# Patient Record
Sex: Female | Born: 1935 | Race: White | Hispanic: No | Marital: Married | Smoking: Former smoker
Health system: Southern US, Community
[De-identification: ages and names within clinical notes are randomized; demographics above are authoritative.]

## PROBLEM LIST (undated history)

## (undated) DIAGNOSIS — E876 Hypokalemia: Secondary | ICD-10-CM

## (undated) DIAGNOSIS — C50919 Malignant neoplasm of unspecified site of unspecified female breast: Secondary | ICD-10-CM

## (undated) DIAGNOSIS — M25551 Pain in right hip: Secondary | ICD-10-CM

## (undated) DIAGNOSIS — M7061 Trochanteric bursitis, right hip: Secondary | ICD-10-CM

## (undated) DIAGNOSIS — G8929 Other chronic pain: Secondary | ICD-10-CM

## (undated) DIAGNOSIS — M1990 Unspecified osteoarthritis, unspecified site: Secondary | ICD-10-CM

## (undated) DIAGNOSIS — R600 Localized edema: Secondary | ICD-10-CM

## (undated) DIAGNOSIS — M7989 Other specified soft tissue disorders: Secondary | ICD-10-CM

## (undated) HISTORY — PX: BUNIONECTOMY: SHX129

## (undated) HISTORY — PX: INTRAOPERATIVE ARTERIOGRAM: SHX5157

## (undated) HISTORY — PX: TONSILLECTOMY: SUR1361

## (undated) HISTORY — PX: MASTECTOMY MODIFIED RADICAL: SUR848

## (undated) HISTORY — PX: BREAST BIOPSY: SHX20

---

## 2017-06-02 ENCOUNTER — Encounter: Payer: Self-pay | Admitting: Gynecology

## 2017-06-02 ENCOUNTER — Other Ambulatory Visit: Payer: Self-pay

## 2017-06-02 ENCOUNTER — Ambulatory Visit
Admission: EM | Admit: 2017-06-02 | Discharge: 2017-06-02 | Disposition: A | Discharge status: Home / Self Care | Payer: Medicare Other | Attending: Family Medicine | Admitting: Family Medicine

## 2017-06-02 DIAGNOSIS — H1031 Unspecified acute conjunctivitis, right eye: Secondary | ICD-10-CM | POA: Diagnosis not present

## 2017-06-02 HISTORY — DX: Trochanteric bursitis, right hip: M70.61

## 2017-06-02 HISTORY — DX: Other chronic pain: G89.29

## 2017-06-02 HISTORY — DX: Localized edema: R60.0

## 2017-06-02 HISTORY — DX: Pain in right hip: M25.551

## 2017-06-02 HISTORY — DX: Unspecified osteoarthritis, unspecified site: M19.90

## 2017-06-02 HISTORY — DX: Other specified soft tissue disorders: M79.89

## 2017-06-02 HISTORY — DX: Hypokalemia: E87.6

## 2017-06-02 HISTORY — DX: Malignant neoplasm of unspecified site of unspecified female breast: C50.919

## 2017-06-02 MED ORDER — CIPROFLOXACIN HCL 0.3 % OP SOLN: 1.0000 [drp] | OPHTHALMIC | 0 refills | Status: AC

## 2017-06-02 NOTE — ED Triage Notes (Signed)
Patient c/o x 6 days right eye drainage/ itching and redness.

## 2017-06-02 NOTE — ED Provider Notes (Signed)
[facility]    CSN: [id number] Arrival date & time: 06/02/17  1308     History   Chief Complaint Chief Complaint  Patient presents with  . Eye Drainage    HPI Brandy Carlson is a 82 y.o. female presents to the urgent care facility for evaluation of right eye drainage, itching and irritation.  Patient states Tuesday of this past week she developed some itching with slight clear drainage.  Over the last 1 to 2 days she has had more purulent drainage with matting of the right eyelid.  She denies any eye pain, vision changes.  She wears contacts.  She denies any left eye symptoms.  She denies any foreign body sensation throughout the right eye.  She has been avoiding her contacts over the last 5 days. HPI  No past medical history on file.  There are no active problems to display for this patient.   OB History   None      Home Medications    Prior to Admission medications   Medication Sig Start Date End Date Taking? Authorizing Provider  abemaciclib (VERZENIO) 100 MG tablet Take by mouth. 04/22/17  Yes [provider]  cetirizine (ZYRTEC) 5 MG tablet Take by mouth. 04/22/17 04/22/18 Yes [provider]  Magnesium Oxide, Antacid, 500 MG CAPS Take by mouth. 01/29/17  Yes [provider]  ondansetron (ZOFRAN) 8 MG tablet Take by mouth. 05/17/17  Yes [provider]  oxyCODONE (OXY IR/ROXICODONE) 5 MG immediate release tablet Take by mouth. 05/21/17 06/20/17 Yes [provider]  oxyCODONE (OXYCONTIN) 10 mg 12 hr tablet Take by mouth. 05/21/17  Yes [provider]  Potassium Chloride ER 20 MEQ TBCR Take by mouth. 01/10/17 01/10/18 Yes [provider]  prochlorperazine (COMPAZINE) 10 MG tablet Take by mouth. 04/22/17  Yes [provider]  simvastatin (ZOCOR) 40 MG tablet Take by mouth. 07/19/16  Yes [provider]  triamcinolone cream (KENALOG) 0.1 % Apply topically. 02/22/17  Yes [provider]  vitamin  C (ASCORBIC ACID) 500 MG tablet Take by mouth. 01/29/17 01/29/18 Yes [provider]  bisacodyl (DULCOLAX) 5 MG EC tablet Take by mouth.    [provider]  Cholecalciferol (VITAMIN D3) 2000 units capsule Take by mouth.    [provider]  ciprofloxacin (CILOXAN) 0.3 % ophthalmic solution Place 1 drop into both eyes every 2 (two) hours. Administer 1 drop, every 2 hours, while awake, for 2 days. Then 1 drop, every 4 hours, while awake, for the next 5 days. 06/02/17   Duanne Guess, PA-C  docusate sodium (COLACE) 100 MG capsule Take by mouth.    [provider]  fulvestrant (FASLODEX) 250 MG/5ML injection Inject into the muscle.    [provider]  hydrochlorothiazide (HYDRODIURIL) 25 MG tablet Take by mouth.    [provider]  ibuprofen (ADVIL,MOTRIN) 800 MG tablet Take by mouth.    [provider]  levothyroxine (SYNTHROID, LEVOTHROID) 112 MCG tablet Take by mouth.    [provider]  naproxen (NAPROSYN) 250 MG tablet Take by mouth.    [provider]    Family History Family History  Problem Relation Age of Onset  . Cancer Mother        lung  . Stroke Father     Social History Social History   Tobacco Use  . Smoking status: Former Research scientist (life sciences)  . Smokeless tobacco: Never Used  Substance Use Topics  . Alcohol use: Yes  .  Drug use: Never     Allergies   Demerol [meperidine hcl]   Review of Systems Review of Systems  Constitutional: Negative for fever.  Eyes: Positive for discharge, redness and itching. Negative for photophobia, pain and visual disturbance.  Skin: Negative for wound.  Neurological: Negative for numbness and headaches.     Physical Exam Triage Vital Signs ED Triage Vitals  Enc Vitals Group     BP 06/02/17 1320 (!) 151/64     Pulse Rate 06/02/17 1320 96     Resp 06/02/17 1320 16     Temp 06/02/17 1320 98.6 F (37 C)     Temp Source 06/02/17 1320 Oral     SpO2 06/02/17 1320  96 %     Weight 06/02/17 1321 130 lb (59 kg)     Height --      Head Circumference --      Peak Flow --      Pain Score 06/02/17 1321 1     Pain Loc --      Pain Edu? --      Excl. in [place]? --    No data found.  Updated Vital Signs BP (!) 151/64 (BP Location: Left Arm)   Pulse 96   Temp 98.6 F (37 C) (Oral)   Resp 16   Wt 130 lb (59 kg)   SpO2 96%   Visual Acuity Right Eye Distance:   Left Eye Distance:   Bilateral Distance:    Right Eye Near:   Left Eye Near:    Bilateral Near:     Physical Exam  Constitutional: She is oriented to person, place, and time. She appears well-developed and well-nourished.  HENT:  Head: Normocephalic and atraumatic.  Eyes: Pupils are equal, round, and reactive to light. Conjunctivae are normal.  Right conjunctival erythema with mild purulent discharge and slight matting of the eyelids.  Fluorescein stain is used along with was lamp showing no sign of corneal abrasion or ulceration.  No sign of foreign body on exam.  Pupils equal round reactive light and patient has full extraocular eye movement.  Neck: Normal range of motion.  Cardiovascular: Normal rate.  Pulmonary/Chest: Effort normal. No respiratory distress.  Musculoskeletal: Normal range of motion.  Neurological: She is alert and oriented to person, place, and time.  Skin: Skin is warm. No rash noted.  Psychiatric: She has a normal mood and affect. Her behavior is normal. Thought content normal.     UC Treatments / Results  Labs (all labs ordered are listed, but only abnormal results are displayed) Labs Reviewed - No data to display  EKG None  Radiology No results found.  Procedures Procedures (including critical care time)  Medications Ordered in UC Medications - No data to display  Initial Impression / Assessment and Plan / UC Course  I have reviewed the triage vital signs and the nursing notes.  Pertinent labs & imaging results that were available during my care of  the patient were reviewed by me and considered in my medical decision making (see chart for details).     82 year old female with right eye conjunctivitis.  She uses contacts and will avoid using contacts for 1 week until antibiotics clear up infection.  She is given a prescription for Ciloxan.  She will use antibiotic eyedrops as prescribed. Final Clinical Impressions(s) / UC Diagnoses   Final diagnoses:  Acute bacterial conjunctivitis of right eye     Discharge Instructions     Please avoid contact  use for 1 week until infection resolves.  If any increasing pain, vision changes please follow-up with primary care provider or ophthalmologist.  Please complete 7-day course of topical antibiotics.   ED Prescriptions    Medication Sig Dispense Auth. Provider   ciprofloxacin (CILOXAN) 0.3 % ophthalmic solution Place 1 drop into both eyes every 2 (two) hours. Administer 1 drop, every 2 hours, while awake, for 2 days. Then 1 drop, every 4 hours, while awake, for the next 5 days. 5 mL Renata Caprice       Duanne Guess, [place] 06/02/17 1359

## 2017-06-02 NOTE — Discharge Instructions (Addendum)
Please avoid contact use for 1 week until infection resolves.  If any increasing pain, vision changes please follow-up with primary care provider or ophthalmologist.  Please complete 7-day course of topical antibiotics.

## 2018-07-02 ENCOUNTER — Other Ambulatory Visit: Payer: Self-pay | Admitting: Neurology

## 2018-07-02 DIAGNOSIS — M4807 Spinal stenosis, lumbosacral region: Secondary | ICD-10-CM

## 2018-07-10 ENCOUNTER — Other Ambulatory Visit: Payer: Self-pay

## 2018-07-10 ENCOUNTER — Ambulatory Visit
Admission: RE | Admit: 2018-07-10 | Discharge: 2018-07-10 | Disposition: A | Discharge status: Home / Self Care | Payer: Medicare Other | Source: Ambulatory Visit | Attending: Neurology | Admitting: Neurology

## 2018-07-10 DIAGNOSIS — M4807 Spinal stenosis, lumbosacral region: Secondary | ICD-10-CM

## 2019-08-26 ENCOUNTER — Ambulatory Visit: Payer: Medicare Other | Admitting: Internal Medicine

## 2019-09-16 DEATH — deceased

## 2020-05-03 IMAGING — MR MRI LUMBAR SPINE WITHOUT CONTRAST
1 series · 15 of 15 positions shown · non-contrast
Comparison: MRI lumbar spine 12/29/2014.

CLINICAL DATA: History of metastatic breast cancer. Left hip and
leg pain for 2 months. Difficulty walking. Status post fall 6 weeks
ago.

EXAM:
MRI LUMBAR SPINE WITHOUT CONTRAST
TECHNIQUE: Multiplanar, multisequence MR imaging of the lumbar spine was
performed. No intravenous contrast was administered.

[Series 6: T2 · sagittal · 4.0mm · 0.81mm/px · 15 of 15 slices shown]
[im 1/15]
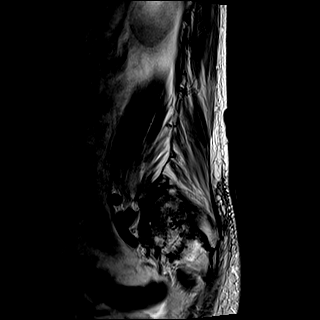
[im 2/15]
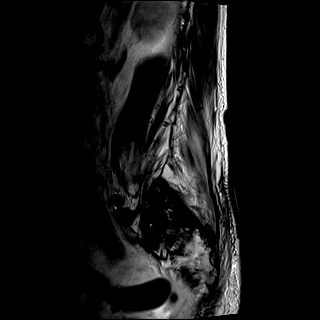
[im 3/15]
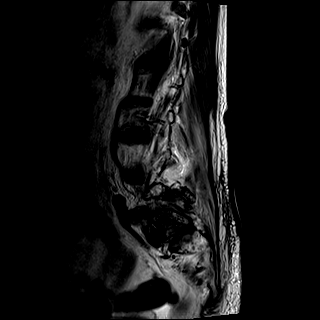
[im 4/15]
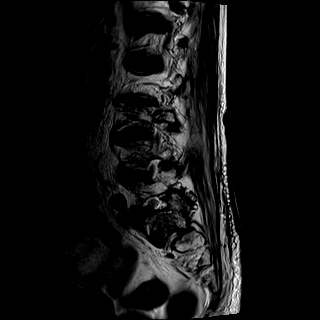
[im 5/15]
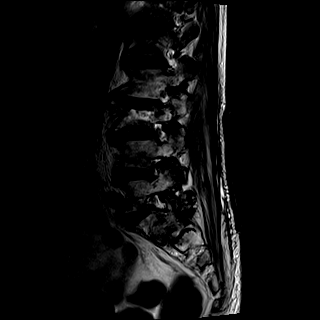
[im 6/15]
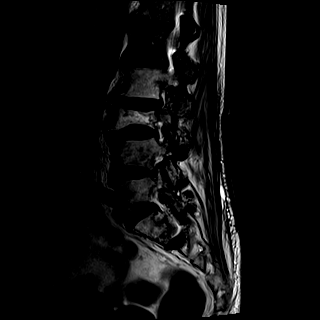
[im 7/15]
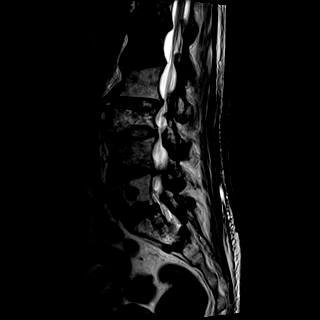
[im 8/15]
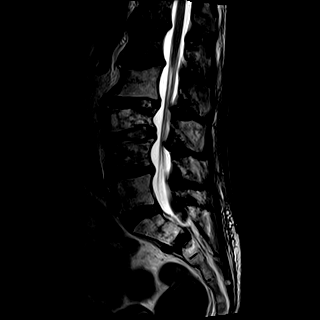
[im 9/15]
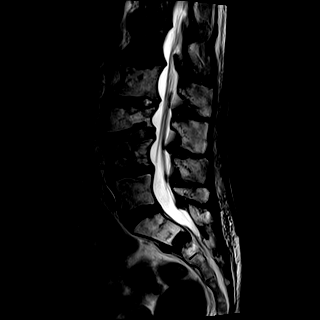
[im 10/15]
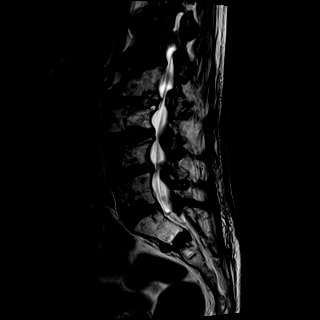
[im 11/15]
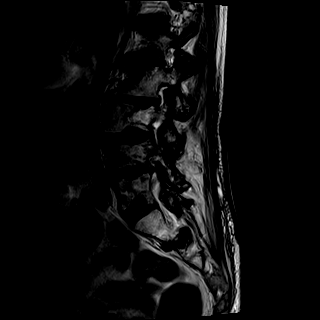
[im 12/15]
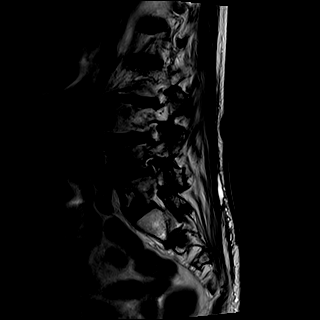
[im 13/15]
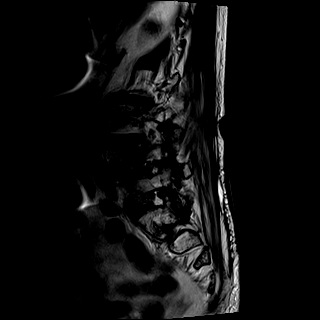
[im 14/15]
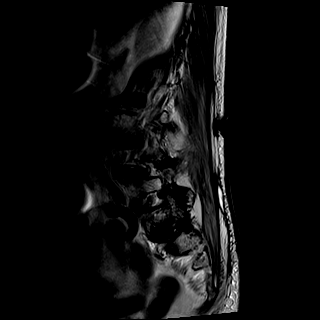
[im 15/15]
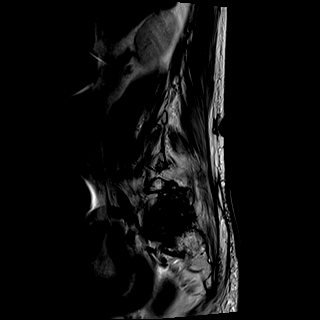

[15 of 15 positions shown; findings below may reference images not displayed]

FINDINGS: Segmentation:  Standard.

Alignment:  Maintained.

Vertebrae: Multiple marrow lesions are identified consistent with
metastatic disease. There is a biconcave compression fracture of L3
with vertebral body height loss of up to approximately 70% and some
marrow edema within the vertebral body. The patient had Schmorl's
nodes in the superior and inferior endplates of L3 on the prior
exam. Minimal superior endplate compression fracture of L2 is new
since the prior examination. There is increased T2 signal within the
vertebral body. Mild inferior endplate compression fracture of L4 is
unchanged.

The patient has bilateral sacral fractures with associated marrow
edema consistent with acute or subacute injury. Extensive metastatic
disease is present throughout the sacrum. There is incomplete
visualization of a fracture of the left ilium adjacent to the SI
joint where metastatic deposit is identified.

Conus medullaris and cauda equina: Conus extends to the L1-2 level.
Conus and cauda equina appear normal.

Paraspinal and other soft tissues: Negative.

Disc levels:

T12-L1: Shallow disc bulge to the left without stenosis.

L1-2: Mild disc bulge and moderate facet arthropathy without
stenosis.

L3-4: Shallow disc bulge to the left and moderately severe facet
degenerative change. Mild central canal stenosis is present.
Foramina are open.

L3-4: Moderately severe facet arthropathy and a shallow disc bulge
with endplate spur. Mild central canal and bilateral foraminal
narrowing.

L4-5: Advanced bilateral facet degenerative disease and a shallow
disc bulge with endplate spur. The central canal is open. Mild to
moderate foraminal narrowing is worse on the right.

L5-S1: Advanced bilateral facet degenerative disease. No disc bulge
or protrusion. No stenosis.
IMPRESSION: Multifocal metastatic disease.

Acute or subacute bilateral sacral fractures are likely pathologic.

Partial visualization of a nondisplaced fracture through the left
ilium adjacent to the SI joint. This fracture is also likely
pathologic.

Mild biconcave compression fracture of L3 is new since the prior MRI
and is likely a pathologic injury. There is increased T2 signal
within the vertebral body which could be due to metastatic disease
but is worrisome for acute or subacute injury.

Minimal superior endplate compression fracture of L2 is likely
pathologic and acute or subacute.

Lumbar spondylosis as described above predominantly involves the
facet joints.
